# Patient Record
Sex: Male | Born: 2000 | Race: Black or African American | Hispanic: No | Marital: Single | Smoking: Never smoker
Health system: Southern US, Community
[De-identification: ages and names within clinical notes are randomized; demographics above are authoritative.]

## PROBLEM LIST (undated history)

## (undated) DIAGNOSIS — J45909 Unspecified asthma, uncomplicated: Secondary | ICD-10-CM

---

## 2021-08-13 ENCOUNTER — Emergency Department (HOSPITAL_COMMUNITY)
Admission: EM | Admit: 2021-08-13 | Discharge: 2021-08-13 | Disposition: A | Discharge status: Home / Self Care | Payer: Medicaid Other | Attending: Emergency Medicine | Admitting: Emergency Medicine

## 2021-08-13 ENCOUNTER — Emergency Department (HOSPITAL_COMMUNITY): Payer: Medicaid Other

## 2021-08-13 ENCOUNTER — Other Ambulatory Visit: Payer: Self-pay

## 2021-08-13 ENCOUNTER — Encounter (HOSPITAL_COMMUNITY): Payer: Self-pay

## 2021-08-13 DIAGNOSIS — R252 Cramp and spasm: Secondary | ICD-10-CM | POA: Insufficient documentation

## 2021-08-13 DIAGNOSIS — M79622 Pain in left upper arm: Secondary | ICD-10-CM | POA: Diagnosis present

## 2021-08-13 DIAGNOSIS — Y92410 Unspecified street and highway as the place of occurrence of the external cause: Secondary | ICD-10-CM | POA: Insufficient documentation

## 2021-08-13 DIAGNOSIS — R519 Headache, unspecified: Secondary | ICD-10-CM | POA: Insufficient documentation

## 2021-08-13 DIAGNOSIS — J45909 Unspecified asthma, uncomplicated: Secondary | ICD-10-CM | POA: Insufficient documentation

## 2021-08-13 DIAGNOSIS — M79621 Pain in right upper arm: Secondary | ICD-10-CM | POA: Insufficient documentation

## 2021-08-13 DIAGNOSIS — M79605 Pain in left leg: Secondary | ICD-10-CM | POA: Diagnosis not present

## 2021-08-13 HISTORY — DX: Unspecified asthma, uncomplicated: J45.909

## 2021-08-13 MED ORDER — METHOCARBAMOL 500 MG PO TABS: 500.0000 mg | ORAL_TABLET | Freq: Two times a day (BID) | ORAL | 0 refills | Status: DC

## 2021-08-13 NOTE — Discharge Instructions (Addendum)
No evidence of fracture on your x ray. I have prescribed you a muscle relaxant to help with your aches. Please only take this before bed as it may cause excessive sleepiness and should not be taken prior to driving or operating heavy machinery.  You can take tylenol and ibuprofen for your pain. Please return if you develop concerning symptoms such as numbness, tingling, persistent vomiting, speech changes, or other new concerning findings.
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
## 2022-11-08 ENCOUNTER — Encounter (HOSPITAL_COMMUNITY): Payer: Self-pay

## 2022-11-08 ENCOUNTER — Ambulatory Visit (INDEPENDENT_AMBULATORY_CARE_PROVIDER_SITE_OTHER): Payer: BC Managed Care – PPO

## 2022-11-08 ENCOUNTER — Ambulatory Visit (HOSPITAL_COMMUNITY)
Admission: RE | Admit: 2022-11-08 | Discharge: 2022-11-08 | Disposition: A | Discharge status: Home / Self Care | Payer: BC Managed Care – PPO | Source: Ambulatory Visit | Attending: Family Medicine | Admitting: Family Medicine

## 2022-11-08 ENCOUNTER — Other Ambulatory Visit: Payer: Self-pay

## 2022-11-08 VITALS — BP 126/72 | HR 69 | Temp 98.5°F | Resp 18

## 2022-11-08 DIAGNOSIS — M25562 Pain in left knee: Secondary | ICD-10-CM | POA: Diagnosis not present

## 2022-11-08 MED ORDER — IBUPROFEN 800 MG PO TABS: 800.0000 mg | ORAL_TABLET | Freq: Three times a day (TID) | ORAL | 0 refills | Status: AC | PRN

## 2022-11-08 NOTE — Discharge Instructions (Signed)
Your x-rays did not show any broken bones.  There was a loose body of bone which may or may not be causing any problems.  They also noted that your patella or kneecap rides high.  Take ibuprofen 800 mg--1 tab every 8 hours as needed for pain.

## 2022-11-08 NOTE — ED Triage Notes (Signed)
Pt reports he has Lt knee pain from a injury that happened in March. Pt reports in March he heard a pop in the same knee. Knee pain did improve but has started to hurt again. Pt reports it is hard to bent Lt knee.

## 2022-11-08 NOTE — ED Provider Notes (Signed)
[facility]    CSN: [id number] Arrival date & time: 11/08/22  8657      History   Chief Complaint Chief Complaint  Patient presents with   Knee Pain    HPI James Bender is a 22 y.o. male.    Knee Pain  Here for left knee pain.  In March he slipped on some slippery flooring, though he did not fall onto his knee.  At that point it popped, sort of in and out, and hurt some.  It is swollen off and on since then.  It can hurt to extend or bend.  No known injury to his knee when he was younger.   Past Medical History:  Diagnosis Date   Asthma     There are no problems to display for this patient.   History reviewed. No pertinent surgical history.     Home Medications    Prior to Admission medications   Medication Sig Start Date End Date Taking? Authorizing Provider  ibuprofen (ADVIL) 800 MG tablet Take 1 tablet (800 mg total) by mouth every 8 (eight) hours as needed (pain). 11/08/22  Yes Zenia Resides, MD    Family History Family History  Problem Relation Age of Onset   Lupus Mother     Social History Social History   Tobacco Use   Smoking status: Never   Smokeless tobacco: Never  Vaping Use   Vaping Use: Never used  Substance Use Topics   Alcohol use: Never   Drug use: Never     Allergies   Patient has no known allergies.   Review of Systems Review of Systems   Physical Exam Triage Vital Signs ED Triage Vitals  Enc Vitals Group     BP 11/08/22 0947 126/72     Pulse Rate 11/08/22 0947 69     Resp 11/08/22 0947 18     Temp 11/08/22 0947 98.5 F (36.9 C)     Temp src --      SpO2 11/08/22 0947 98 %     Weight --      Height --      Head Circumference --      Peak Flow --      Pain Score 11/08/22 0946 6     Pain Loc --      Pain Edu? --      Excl. in GC? --    No data found.  Updated Vital Signs BP 126/72   Pulse 69   Temp 98.5 F (36.9 C)   Resp 18   SpO2 98%   Visual Acuity Right Eye Distance:   Left  Eye Distance:   Bilateral Distance:    Right Eye Near:   Left Eye Near:    Bilateral Near:     Physical Exam Vitals reviewed.  Constitutional:      General: He is not in acute distress.    Appearance: He is not ill-appearing, toxic-appearing or diaphoretic.  Musculoskeletal:     Comments: There is some mild puffiness of the medial aspect of the anterior knee on the left.  No joint line tenderness and no mass or deformity.  Range of motion is intact and there is no locking or laxity.  Skin:    Coloration: Skin is not pale.  Neurological:     General: No focal deficit present.     Mental Status: He is alert and oriented to person, place, and time.      UC Treatments /  Results  Labs (all labs ordered are listed, but only abnormal results are displayed) Labs Reviewed - No data to display  EKG   Radiology DG Knee AP/LAT W/Sunrise Left  Result Date: 11/08/2022 CLINICAL DATA:  Left knee pain and swelling EXAM: LEFT KNEE 3 VIEWS COMPARISON:  None Available. FINDINGS: No evidence of fracture. Small joint effusion. Patella alta alignment. No dislocation. There is a 8 x 4 mm corticated ossification projecting at the medial margin of the patellofemoral compartment seen on the sunrise view, possibly representing a loose body. Joint spaces are maintained. Soft tissues are unremarkable. IMPRESSION: 1. No acute fracture. 2. Small joint effusion. 3. Patella alta alignment. 4. There is a 8 mm osseous density projecting at the medial margin of the patellofemoral compartment, possibly representing a loose body. Electronically Signed   By: Duanne Guess D.O.   On: 11/08/2022 10:25    Procedures Procedures (including critical care time)  Medications Ordered in UC Medications - No data to display  Initial Impression / Assessment and Plan / UC Course  I have reviewed the triage vital signs and the nursing notes.  Pertinent labs & imaging results that were available during my care of the  patient were reviewed by me and considered in my medical decision making (see chart for details).        X-ray shows a possible loose body.  Also there is patella alta alignment.  These findings were discussed with the patient.  He is provided a knee brace and some ibuprofen 800 mg is sent into the pharmacy.  He is given contact information for orthopedics.  Final Clinical Impressions(s) / UC Diagnoses   Final diagnoses:  Acute pain of left knee     Discharge Instructions      Your x-rays did not show any broken bones.  There was a loose body of bone which may or may not be causing any problems.  They also noted that your patella or kneecap rides high.  Take ibuprofen 800 mg--1 tab every 8 hours as needed for pain.      ED Prescriptions     Medication Sig Dispense Auth. Provider   ibuprofen (ADVIL) 800 MG tablet Take 1 tablet (800 mg total) by mouth every 8 (eight) hours as needed (pain). 21 tablet Sayla Golonka, Janace Aris, MD      PDMP not reviewed this encounter.   Zenia Resides, MD 11/08/22 [phone]

## 2022-12-14 ENCOUNTER — Ambulatory Visit (HOSPITAL_COMMUNITY)
Admission: RE | Admit: 2022-12-14 | Discharge: 2022-12-14 | Disposition: A | Discharge status: Home / Self Care | Payer: BC Managed Care – PPO | Source: Ambulatory Visit | Attending: Emergency Medicine | Admitting: Emergency Medicine

## 2022-12-14 ENCOUNTER — Other Ambulatory Visit: Payer: Self-pay

## 2022-12-14 ENCOUNTER — Encounter (HOSPITAL_COMMUNITY): Payer: Self-pay

## 2022-12-14 VITALS — BP 131/79 | HR 78 | Temp 98.3°F | Resp 20

## 2022-12-14 DIAGNOSIS — J069 Acute upper respiratory infection, unspecified: Secondary | ICD-10-CM | POA: Diagnosis not present

## 2022-12-14 MED ORDER — AZITHROMYCIN 250 MG PO TABS: 250.0000 mg | ORAL_TABLET | Freq: Every day | ORAL | 0 refills | Status: AC

## 2022-12-14 NOTE — ED Triage Notes (Signed)
Patient reports stuffy nose, minor headaches, chills at night.  Last night started nausea and fatigue.  Patient has benadryl thinking this was allergy related

## 2022-12-14 NOTE — ED Provider Notes (Signed)
[facility]    CSN: [id number] Arrival date & time: 12/14/22  1132      History   Chief Complaint Chief Complaint  Patient presents with   Appointment    11:30   Fever    HPI James Bender is a 22 y.o. male.   Evaluation of subjective fever, chills, nasal congestion and intermittent generalized headaches present for 7 days.  Symptoms worsening overnight, interfering with sleep and making it difficult to breathe when lying flat.  Has attempted use of Benadryl and ibuprofen for management.  Tolerating clear liquids.  No known sick contacts prior.  History of asthma.  Denies cough, shortness of breath or wheezing.   Past Medical History:  Diagnosis Date   Asthma     There are no problems to display for this patient.   History reviewed. No pertinent surgical history.     Home Medications    Prior to Admission medications   Medication Sig Start Date End Date Taking? Authorizing Provider  ibuprofen (ADVIL) 800 MG tablet Take 1 tablet (800 mg total) by mouth every 8 (eight) hours as needed (pain). 11/08/22   Zenia Resides, MD    Family History Family History  Problem Relation Age of Onset   Lupus Mother     Social History Social History   Tobacco Use   Smoking status: Never   Smokeless tobacco: Never  Vaping Use   Vaping Use: Never used  Substance Use Topics   Alcohol use: Never   Drug use: Never     Allergies   Patient has no known allergies.   Review of Systems Review of Systems  Constitutional:  Positive for chills and fever. Negative for activity change, appetite change, diaphoresis, fatigue and unexpected weight change.  HENT:  Positive for congestion and rhinorrhea. Negative for dental problem, drooling, ear discharge, ear pain, facial swelling, hearing loss, mouth sores, nosebleeds, postnasal drip, sinus pressure, sinus pain, sneezing, sore throat, tinnitus, trouble swallowing and voice change.   Respiratory: Negative.     Cardiovascular: Negative.   Gastrointestinal: Negative.   Skin: Negative.   Neurological:  Positive for headaches. Negative for dizziness, tremors, seizures, syncope, facial asymmetry, speech difficulty, weakness, light-headedness and numbness.     Physical Exam Triage Vital Signs ED Triage Vitals  Enc Vitals Group     BP 12/14/22 1209 131/79     Pulse Rate 12/14/22 1209 78     Resp 12/14/22 1209 20     Temp 12/14/22 1209 98.3 F (36.8 C)     Temp Source 12/14/22 1209 Oral     SpO2 12/14/22 1209 97 %     Weight --      Height --      Head Circumference --      Peak Flow --      Pain Score 12/14/22 1207 3     Pain Loc --      Pain Edu? --      Excl. in GC? --    No data found.  Updated Vital Signs BP 131/79 (BP Location: Left Arm) Comment (BP Location): large cuff  Pulse 78   Temp 98.3 F (36.8 C) (Oral)   Resp 20   SpO2 97%   Visual Acuity Right Eye Distance:   Left Eye Distance:   Bilateral Distance:    Right Eye Near:   Left Eye Near:    Bilateral Near:     Physical Exam Constitutional:      Appearance: Normal  appearance.  HENT:     Right Ear: Tympanic membrane, ear canal and external ear normal.     Left Ear: Tympanic membrane, ear canal and external ear normal.     Nose: Congestion present. No rhinorrhea.     Mouth/Throat:     Mouth: Mucous membranes are moist.     Pharynx: No posterior oropharyngeal erythema.  Eyes:     Extraocular Movements: Extraocular movements intact.  Cardiovascular:     Rate and Rhythm: Normal rate and regular rhythm.     Pulses: Normal pulses.     Heart sounds: Normal heart sounds.  Pulmonary:     Effort: Pulmonary effort is normal.     Breath sounds: Normal breath sounds.  Musculoskeletal:     Cervical back: Normal range of motion and neck supple.  Skin:    General: Skin is warm and dry.  Neurological:     Mental Status: He is alert and oriented to person, place, and time. Mental status is at baseline.      UC  Treatments / Results  Labs (all labs ordered are listed, but only abnormal results are displayed) Labs Reviewed - No data to display  EKG   Radiology No results found.  Procedures Procedures (including critical care time)  Medications Ordered in UC Medications - No data to display  Initial Impression / Assessment and Plan / UC Course  I have reviewed the triage vital signs and the nursing notes.  Pertinent labs & imaging results that were available during my care of the patient were reviewed by me and considered in my medical decision making (see chart for details).  Acute URI  Patient is in no signs of distress nor toxic appearing.  Vital signs are stable.  Low suspicion for pneumonia, pneumothorax or bronchitis and therefore will defer imaging.  Prescribe Z-Pak.May use additional over-the-counter medications as needed for supportive care.  May follow-up with urgent care as needed if symptoms persist or worsen.  Note given.   Final Clinical Impressions(s) / UC Diagnoses   Final diagnoses:  None   Discharge Instructions   None    ED Prescriptions   None    PDMP not reviewed this encounter.   Valinda Hoar, NP 12/14/22 1227

## 2022-12-14 NOTE — Discharge Instructions (Addendum)
Begin azithromycin as directed to provide coverage for bacteria    You can take Tylenol and/or Ibuprofen as needed for fever reduction and pain relief.   For cough: honey 1/2 to 1 teaspoon (you can dilute the honey in water or another fluid).  You can also use guaifenesin and dextromethorphan for cough. You can use a humidifier for chest congestion and cough.  If you don't have a humidifier, you can sit in the bathroom with the hot shower running.      For sore throat: try warm salt water gargles, cepacol lozenges, throat spray, warm tea or water with lemon/honey, popsicles or ice, or OTC cold relief medicine for throat discomfort.   For congestion: take a daily anti-histamine like Zyrtec, Claritin, and a oral decongestant, such as pseudoephedrine.  You can also use Flonase 1-2 sprays in each nostril daily.   It is important to stay hydrated: drink plenty of fluids (water, gatorade/powerade/pedialyte, juices, or teas) to keep your throat moisturized and help further relieve irritation/discomfort.
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
